# Patient Record
Sex: Male | Born: 1960 | Race: Black or African American | Hispanic: No | Marital: Single | Smoking: Former smoker
Health system: Southern US, Community
[De-identification: ages and names within clinical notes are randomized; demographics above are authoritative.]

## PROBLEM LIST (undated history)

## (undated) DIAGNOSIS — I209 Angina pectoris, unspecified: Secondary | ICD-10-CM

## (undated) DIAGNOSIS — I10 Essential (primary) hypertension: Secondary | ICD-10-CM

## (undated) DIAGNOSIS — L309 Dermatitis, unspecified: Secondary | ICD-10-CM

---

## 2005-06-14 ENCOUNTER — Emergency Department: Payer: Self-pay | Admitting: General Practice

## 2006-07-18 ENCOUNTER — Emergency Department: Payer: Self-pay | Admitting: Emergency Medicine

## 2006-08-30 ENCOUNTER — Ambulatory Visit: Payer: Self-pay | Admitting: Physician Assistant

## 2006-10-20 ENCOUNTER — Other Ambulatory Visit: Payer: Self-pay

## 2006-10-27 ENCOUNTER — Ambulatory Visit: Payer: Self-pay | Admitting: Unknown Physician Specialty

## 2006-11-02 ENCOUNTER — Emergency Department: Payer: Self-pay | Admitting: Emergency Medicine

## 2006-11-25 ENCOUNTER — Emergency Department: Payer: Self-pay | Admitting: General Practice

## 2008-02-20 ENCOUNTER — Ambulatory Visit: Payer: Self-pay | Admitting: Family Medicine

## 2008-07-31 ENCOUNTER — Encounter: Payer: Self-pay | Admitting: Orthopedic Surgery

## 2008-08-28 ENCOUNTER — Encounter: Payer: Self-pay | Admitting: Orthopedic Surgery

## 2009-02-27 ENCOUNTER — Inpatient Hospital Stay: Payer: Self-pay | Admitting: Internal Medicine

## 2009-04-27 ENCOUNTER — Ambulatory Visit: Payer: Self-pay | Admitting: Unknown Physician Specialty

## 2010-04-23 ENCOUNTER — Emergency Department: Payer: Self-pay | Admitting: Emergency Medicine

## 2012-07-10 ENCOUNTER — Emergency Department: Payer: Self-pay | Admitting: Emergency Medicine

## 2012-07-10 LAB — CBC
HCT: 39.9 % — ABNORMAL LOW (ref 40.0–52.0)
HGB: 13.8 g/dL (ref 13.0–18.0)
MCH: 29.2 pg (ref 26.0–34.0)
MCHC: 34.7 g/dL (ref 32.0–36.0)
MCV: 84 fL (ref 80–100)
RDW: 12.9 % (ref 11.5–14.5)
WBC: 5.5 10*3/uL (ref 3.8–10.6)

## 2012-07-10 LAB — COMPREHENSIVE METABOLIC PANEL
Alkaline Phosphatase: 66 U/L (ref 50–136)
Calcium, Total: 8.8 mg/dL (ref 8.5–10.1)
Chloride: 109 mmol/L — ABNORMAL HIGH (ref 98–107)
Co2: 27 mmol/L (ref 21–32)
Creatinine: 1.01 mg/dL (ref 0.60–1.30)
EGFR (African American): >60
EGFR (Non-African Amer.): >60
Glucose: 104 mg/dL — ABNORMAL HIGH (ref 65–99)
SGOT(AST): 28 U/L (ref 15–37)
SGPT (ALT): 23 U/L (ref 12–78)

## 2012-07-10 LAB — CK TOTAL AND CKMB (NOT AT ARMC): CK, Total: 146 U/L (ref 35–232)

## 2019-08-21 ENCOUNTER — Other Ambulatory Visit: Payer: Self-pay

## 2019-08-21 DIAGNOSIS — Z20822 Contact with and (suspected) exposure to covid-19: Secondary | ICD-10-CM

## 2019-08-23 LAB — NOVEL CORONAVIRUS, NAA: SARS-CoV-2, NAA: NOT DETECTED

## 2020-06-17 ENCOUNTER — Other Ambulatory Visit: Payer: Self-pay

## 2020-06-17 ENCOUNTER — Encounter: Payer: Self-pay | Admitting: Urology

## 2020-06-17 ENCOUNTER — Ambulatory Visit: Payer: Managed Care, Other (non HMO) | Admitting: Urology

## 2020-06-17 VITALS — BP 126/77 | HR 83 | Ht 74.0 in | Wt 305.0 lb

## 2020-06-17 DIAGNOSIS — R972 Elevated prostate specific antigen [PSA]: Secondary | ICD-10-CM | POA: Diagnosis not present

## 2020-06-17 DIAGNOSIS — N400 Enlarged prostate without lower urinary tract symptoms: Secondary | ICD-10-CM | POA: Diagnosis not present

## 2020-06-17 DIAGNOSIS — I10 Essential (primary) hypertension: Secondary | ICD-10-CM | POA: Diagnosis not present

## 2020-06-17 DIAGNOSIS — L309 Dermatitis, unspecified: Secondary | ICD-10-CM | POA: Insufficient documentation

## 2020-06-17 DIAGNOSIS — E785 Hyperlipidemia, unspecified: Secondary | ICD-10-CM | POA: Insufficient documentation

## 2020-06-17 DIAGNOSIS — K5792 Diverticulitis of intestine, part unspecified, without perforation or abscess without bleeding: Secondary | ICD-10-CM | POA: Insufficient documentation

## 2020-06-17 MED ORDER — TAMSULOSIN HCL 0.4 MG PO CAPS: 0.4000 mg | ORAL_CAPSULE | Freq: Every day | ORAL | 0 refills | Status: DC

## 2020-06-17 NOTE — Progress Notes (Signed)
   06/17/2020 8:42 AM   Fred Scott Aug 06, 1961 [id number]  Referring provider: Theotis Burrow, MD [address] [place],  [place] [address]  Chief Complaint  Patient presents with  . Elevated PSA    HPI: Fred Scott is a 59 y.o. male seen at the request of Dr. Alene Scott for an elevated PSA.   PSA 05/30/2020 elevated 5.4  Prior PSA results 3.1 07/2019 and 1.5 05/2013  Denies bothersome LUTS  Denies dysuria, gross hematuria  Denies flank, abdominal or pelvic pain  No family history prostate cancer  No prior urologic history or GU evaluation   PMH: History reviewed. No pertinent past medical history.  Surgical History: History reviewed. No pertinent surgical history.  Home Medications:  Allergies as of 06/17/2020   No Known Allergies     Medication List       Accurate as of June 17, 2020  8:42 AM. If you have any questions, ask your nurse or doctor.        amlodipine-atorvastatin 10-10 MG tablet Commonly known as: CADUET Take by mouth.   hydrochlorothiazide 12.5 MG capsule Commonly known as: MICROZIDE Take by mouth.   pravastatin 10 MG tablet Commonly known as: PRAVACHOL Take by mouth.       Allergies: No Known Allergies  Family History: History reviewed. No pertinent family history.  Social History:  reports that he has quit smoking. He has never used smokeless tobacco. No history on file for alcohol use and drug use.   Physical Exam: BP 126/77   Pulse 83   Ht 6\' 2"  (1.88 m)   Wt (!) 305 lb (138.3 kg)   BMI 39.16 kg/m   Constitutional:  Alert and oriented, No acute distress. HEENT: [place] AT, moist mucus membranes.  Trachea midline, no masses. Cardiovascular: No clubbing, cyanosis, or edema. Respiratory: Normal respiratory effort, no increased work of breathing. GU: Prostate 45 g, smooth without nodules Skin: No rashes, bruises or suspicious lesions. Neurologic: Grossly intact, no focal deficits, moving all 4  extremities. Psychiatric: Normal mood and affect.    Assessment & Plan:    1.  Elevated PSA Although PSA is a prostate cancer screening test he was informed that cancer is not the most common cause of an elevated PSA. Other potential causes including BPH and inflammation were discussed. He was informed that the only way to adequately diagnose prostate cancer would be a transrectal ultrasound and biopsy of the prostate. The procedure was discussed including potential risks of bleeding and infection/sepsis. He was also informed that a negative biopsy does not conclusively rule out the possibility that prostate cancer may be present and that continued monitoring is required. The use of newer adjunctive blood tests including PHI and 4kScore were discussed. The use of multiparametric prostate MRI was also discussed however is not typically used for initial evaluation of an elevated PSA. Continued periodic surveillance was also discussed.  I initially recommended a 30-day alpha-blocker course with a repeat PSA in 1 month.  He will think over the above options if his PSA is persistently elevated.  Rx tamsulosin sent to [facility] [address], [place], [place] [address] [phone]

## 2020-07-09 ENCOUNTER — Other Ambulatory Visit: Payer: Self-pay | Admitting: Urology

## 2020-07-20 ENCOUNTER — Other Ambulatory Visit: Payer: Self-pay

## 2020-07-20 DIAGNOSIS — R972 Elevated prostate specific antigen [PSA]: Secondary | ICD-10-CM

## 2020-07-20 DIAGNOSIS — N400 Enlarged prostate without lower urinary tract symptoms: Secondary | ICD-10-CM

## 2020-07-21 ENCOUNTER — Telehealth: Payer: Self-pay | Admitting: Urology

## 2020-07-21 ENCOUNTER — Telehealth: Payer: Self-pay

## 2020-07-21 ENCOUNTER — Other Ambulatory Visit: Payer: Self-pay

## 2020-07-21 MED ORDER — ALFUZOSIN HCL ER 10 MG PO TB24: 10.0000 mg | ORAL_TABLET | Freq: Every day | ORAL | 0 refills | Status: DC

## 2020-07-21 NOTE — Telephone Encounter (Signed)
Patient states he was taking Tamsulosin  For 2 weeks . He states hie allergic reaction is a very bad yeast infection .

## 2020-07-21 NOTE — Telephone Encounter (Signed)
Can cancel the lab visit.  Please find out what the "allergic reaction" was as that will determine any alternative medication

## 2020-07-21 NOTE — Telephone Encounter (Signed)
Pt. Would like a call back ASAP to let him know if he needs to cancel his lab appt. Today at 11:30. He was told by his PCP to stop the Tamsulosin 2 weeks ago due to allergic reaction and he thinks the labs today will not be accurate. He would also like to discuss a new medication option.

## 2020-07-21 NOTE — Telephone Encounter (Signed)
Unlikely Flomax is the cause of the yeast infection.  I sent in a different Rx to pharmacy and he will need lab visit with PSA in 1 month

## 2020-07-21 NOTE — Addendum Note (Signed)
Addended by: Abbie Sons on: 07/21/2020 09:47 PM   Modules accepted: Orders

## 2020-07-21 NOTE — Telephone Encounter (Signed)
Called pt, no answer. Informed pt that it was ok to have PSA drawn today despite discontinuing Flomax.

## 2020-07-22 ENCOUNTER — Other Ambulatory Visit: Payer: Managed Care, Other (non HMO)

## 2020-07-22 NOTE — Telephone Encounter (Signed)
Notified patient as instructed, patient pleased. Discussed follow-up appointments, patient agrees  

## 2020-07-23 ENCOUNTER — Other Ambulatory Visit: Payer: Managed Care, Other (non HMO)

## 2020-08-13 ENCOUNTER — Other Ambulatory Visit: Payer: Self-pay | Admitting: Urology

## 2020-08-24 ENCOUNTER — Other Ambulatory Visit: Payer: Managed Care, Other (non HMO)

## 2020-08-24 ENCOUNTER — Other Ambulatory Visit: Payer: Self-pay

## 2020-08-24 DIAGNOSIS — N400 Enlarged prostate without lower urinary tract symptoms: Secondary | ICD-10-CM

## 2020-08-24 DIAGNOSIS — R972 Elevated prostate specific antigen [PSA]: Secondary | ICD-10-CM

## 2020-08-25 LAB — PSA: Prostate Specific Ag, Serum: 3.9 ng/mL (ref 0.0–4.0)

## 2020-08-28 ENCOUNTER — Other Ambulatory Visit: Payer: Self-pay | Admitting: Urology

## 2020-09-03 ENCOUNTER — Telehealth: Payer: Self-pay | Admitting: *Deleted

## 2020-09-03 NOTE — Telephone Encounter (Signed)
-----   Message from Abbie Sons, MD sent at 09/03/2020 12:32 PM EDT ----- Follow-up PSA improved but still slightly above baseline at 3.9.  Recommend 95-month follow-up visit with repeat PSA prior

## 2020-09-03 NOTE — Telephone Encounter (Signed)
Notified patient as instructed, patient pleased. Discussed follow-up appointments, patient agrees  

## 2020-10-15 ENCOUNTER — Ambulatory Visit
Admission: RE | Admit: 2020-10-15 | Discharge: 2020-10-15 | Disposition: A | Discharge status: Home / Self Care | Payer: 59 | Attending: Family Medicine | Admitting: Family Medicine

## 2020-10-15 ENCOUNTER — Other Ambulatory Visit: Payer: Self-pay

## 2020-10-15 ENCOUNTER — Other Ambulatory Visit: Payer: Self-pay | Admitting: Family Medicine

## 2020-10-15 ENCOUNTER — Ambulatory Visit
Admission: RE | Admit: 2020-10-15 | Discharge: 2020-10-15 | Disposition: A | Discharge status: Home / Self Care | Payer: 59 | Source: Ambulatory Visit | Attending: Family Medicine | Admitting: Family Medicine

## 2020-10-15 DIAGNOSIS — M47817 Spondylosis without myelopathy or radiculopathy, lumbosacral region: Secondary | ICD-10-CM | POA: Insufficient documentation

## 2020-10-15 DIAGNOSIS — M47812 Spondylosis without myelopathy or radiculopathy, cervical region: Secondary | ICD-10-CM | POA: Diagnosis not present

## 2020-10-15 DIAGNOSIS — M549 Dorsalgia, unspecified: Secondary | ICD-10-CM | POA: Diagnosis present

## 2020-11-02 ENCOUNTER — Ambulatory Visit (INDEPENDENT_AMBULATORY_CARE_PROVIDER_SITE_OTHER): Payer: 59 | Admitting: Dermatology

## 2020-11-02 ENCOUNTER — Other Ambulatory Visit: Payer: Self-pay

## 2020-11-02 DIAGNOSIS — L280 Lichen simplex chronicus: Secondary | ICD-10-CM | POA: Diagnosis not present

## 2020-11-02 DIAGNOSIS — L300 Nummular dermatitis: Secondary | ICD-10-CM

## 2020-11-02 MED ORDER — CLOBETASOL PROPIONATE 0.05 % EX CREA: TOPICAL_CREAM | CUTANEOUS | 1 refills | Status: AC

## 2020-11-02 NOTE — Patient Instructions (Addendum)
Clobetasol Cream - Apply to lower legs twice a day until rash improved. Avoid face, groin, underarms. Apply CeraVe Cream after Clobetasol. Recommend Dove Bodywash in shower.    Dry Skin Care  What causes dry skin?  Dry skin is common and results from inadequate moisture in the outer skin layers. Dry skin usually results from the excessive loss of moisture from the skin surface. This occurs due to two major factors: 1. Normally the skin's oil glands deposit a layer of oil on the skin's surface. This layer of oil prevents the loss of moisture from the skin. Exposure to soaps, cleaners, solvents, and disinfectants removes this oily film, allowing water to escape. 2. Water loss from the skin increases when the humidity is low. During winter months we spend a lot of time indoors where the air is heated. Heated air has very low humidity. This also contributes to dry skin.  A tendency for dry skin may accompany such disorders as eczema. Also, as people age, the number of functioning oil glands decreases, and the tendency toward dry skin can be a sensation of skin tightness when emerging from the shower.  How do I manage dry skin?  1. Humidify your environment. This can be accomplished by using a humidifier in your bedroom at night during winter months. 2. Bathing can actually put moisture back into your skin if done right. Take the following steps while bathing to sooth dry skin:  Avoid hot water, which only dries the skin and makes itching worse. Use warm water.  Avoid washcloths or extensive rubbing or scrubbing.  Use mild soaps like unscented Dove, Oil of Olay, Cetaphil, Basis, or CeraVe.  If you take baths rather than showers, rinse off soap residue with clean water before getting out of tub.  Once out of the shower/tub, pat dry gently with a soft towel. Leave your skin damp.  While still damp, apply any medicated ointment/cream you were prescribed to the affected areas. After you apply  your medicated ointment/cream, then apply your moisturizer to your whole body.This is the most important step in dry skin care. If this is omitted, your skin will continue to be dry.  The choice of moisturizer is also very important. In general, lotion will not provider enough moisture to severely dry skin because it is water based. You should use an ointment or cream. Moisturizers should also be unscented. Good choices include Vaseline (plain petrolatum), Aquaphor, Cetaphil, CeraVe, Vanicream, DML Forte, Aveeno moisture, or Eucerin Cream.  Bath oils can be helpful, but do not replace the application of moisturizer after the bath. In addition, they make the tub slippery causing an increased risk for falls. Therefore, we do not recommend their use.

## 2020-11-02 NOTE — Progress Notes (Signed)
   New Patient Visit  Subjective  Fred Scott is a 59 y.o. male who presents for the following: Rash (lower legs, dry and itchy x 1 month, using Cortisone-10). Had similar rash on legs years ago.  Uses Ivory soap and Dermaseal lotion.    The following portions of the chart were reviewed this encounter and updated as appropriate:      Review of Systems:  No other skin or systemic complaints except as noted in HPI or Assessment and Plan.  Objective  Well appearing patient in no apparent distress; mood and affect are within normal limits.  A focused examination was performed including lower legs. Relevant physical exam findings are noted in the Assessment and Plan.  Objective  lower legs: Hyperpigmented violaceous scaly patches with few excoriated firm papules, mild lichenification.   Assessment & Plan  Nummular dermatitis lower legs  With LSC component Start clobetasol cream Apply to itchy areas lower legs qd/bid until rash improved. Avoid face, groin, axilla. Dsp 60g 1Rf.  Recommend mild soap and moisturizing cream 1-2 times daily.  Dove soap and CeraVe Cream daily. Avoid scratching  Topical steroids (such as triamcinolone, fluocinolone, fluocinonide, mometasone, clobetasol, halobetasol, betamethasone, hydrocortisone) can cause thinning and lightening of the skin if they are used for too long in the same area. Your physician has selected the right strength medicine for your problem and area affected on the body. Please use your medication only as directed by your physician to prevent side effects.     clobetasol cream (TEMOVATE) 0.05 % - lower legs  Return if symptoms worsen or fail to improve.   IJamesetta Orleans, CMA, am acting as scribe for Brendolyn Patty, MD .  Documentation: I have reviewed the above documentation for accuracy and completeness, and I agree with the above.  Brendolyn Patty MD

## 2020-12-03 ENCOUNTER — Other Ambulatory Visit: Payer: Self-pay | Admitting: Urology

## 2021-03-02 ENCOUNTER — Other Ambulatory Visit: Payer: Self-pay

## 2021-03-02 DIAGNOSIS — R972 Elevated prostate specific antigen [PSA]: Secondary | ICD-10-CM

## 2021-03-08 ENCOUNTER — Other Ambulatory Visit: Payer: 59

## 2021-03-08 ENCOUNTER — Other Ambulatory Visit: Payer: Self-pay

## 2021-03-08 DIAGNOSIS — R972 Elevated prostate specific antigen [PSA]: Secondary | ICD-10-CM

## 2021-03-09 LAB — PSA: Prostate Specific Ag, Serum: 5.2 ng/mL — ABNORMAL HIGH (ref 0.0–4.0)

## 2021-03-10 ENCOUNTER — Other Ambulatory Visit: Payer: Self-pay

## 2021-03-10 ENCOUNTER — Ambulatory Visit (INDEPENDENT_AMBULATORY_CARE_PROVIDER_SITE_OTHER): Payer: 59 | Admitting: Urology

## 2021-03-10 ENCOUNTER — Encounter: Payer: Self-pay | Admitting: Urology

## 2021-03-10 VITALS — BP 138/83 | HR 84 | Ht 74.0 in | Wt 299.0 lb

## 2021-03-10 DIAGNOSIS — R972 Elevated prostate specific antigen [PSA]: Secondary | ICD-10-CM

## 2021-03-10 DIAGNOSIS — N401 Enlarged prostate with lower urinary tract symptoms: Secondary | ICD-10-CM

## 2021-03-10 MED ORDER — DOXAZOSIN MESYLATE 2 MG PO TABS: 2.0000 mg | ORAL_TABLET | Freq: Every day | ORAL | 1 refills | Status: DC

## 2021-03-10 NOTE — Progress Notes (Signed)
   03/10/2021 9:30 AM   Fred Scott 14-Jan-1961 [id number]  Referring provider: No referring provider defined for this encounter.  Chief Complaint  Patient presents with  . Elevated PSA    HPI: 60 y.o. male presents for 6 month follow-up.   Initially seen 06/17/2020 for a PSA of 5.4  Benign DRE  Took tamsulosin x30 days and repeat PSA was 3.9  Repeat PSA 03/08/2021 increased 5.2  States he could not tolerate tamsulosin secondary to yeast infections  He was switched to alfuzosin and states he voids quite well on this medication however only takes it once a week because it also causes yeast infections  Denies dysuria, gross hematuria  No flank, abdominal or pelvic pain  PMH: No past medical history on file.  Surgical History: No past surgical history on file.  Home Medications:  Allergies as of 03/10/2021   No Known Allergies     Medication List       Accurate as of March 10, 2021  9:30 AM. If you have any questions, ask your nurse or doctor.        alfuzosin 10 MG 24 hr tablet Commonly known as: UROXATRAL TAKE 1 TABLET (10 MG TOTAL) BY MOUTH DAILY WITH BREAKFAST.   amlodipine-atorvastatin 10-10 MG tablet Commonly known as: CADUET Take by mouth.   clobetasol cream 0.05 % Commonly known as: TEMOVATE Apply to itchy rash on legs 2 times daily until improved. Avoid face, groin, underarms.   hydrochlorothiazide 12.5 MG capsule Commonly known as: MICROZIDE Take by mouth.   pravastatin 10 MG tablet Commonly known as: PRAVACHOL Take by mouth.       Allergies: No Known Allergies  Family History: No family history on file.  Social History:  reports that he has quit smoking. He has never used smokeless tobacco. No history on file for alcohol use and drug use.   Physical Exam: BP 138/83   Pulse 84   Ht 6\' 2"  (1.88 m)   Wt 299 lb (135.6 kg)   BMI 38.39 kg/m   Constitutional:  Alert and oriented, No acute distress. HEENT: [place] AT, moist mucus  membranes.  Trachea midline, no masses. Cardiovascular: No clubbing, cyanosis, or edema. Respiratory: Normal respiratory effort, no increased work of breathing. Skin: No rashes, bruises or suspicious lesions. Neurologic: Grossly intact, no focal deficits, moving all 4 extremities. Psychiatric: Normal mood and affect.   Assessment & Plan:    1.  Elevated PSA  He requested a "less strong" alpha-blocker and wanted to have his PSA repeated in 1 month.  If PSA remains elevated he desires to schedule prostate MRI  Rx doxazosin 2 mg nightly sent to pharmacy, potential side effects were discussed  2.  BPH with LUTS  As above   Abbie Sons, MD  [facility] [address], [place], [place] [address] [phone]

## 2021-03-16 ENCOUNTER — Other Ambulatory Visit: Payer: Self-pay | Admitting: *Deleted

## 2021-03-16 DIAGNOSIS — R972 Elevated prostate specific antigen [PSA]: Secondary | ICD-10-CM

## 2021-04-01 ENCOUNTER — Other Ambulatory Visit: Payer: Self-pay | Admitting: Urology

## 2021-04-12 ENCOUNTER — Other Ambulatory Visit: Payer: 59

## 2021-04-12 ENCOUNTER — Other Ambulatory Visit: Payer: Self-pay

## 2021-04-12 DIAGNOSIS — R972 Elevated prostate specific antigen [PSA]: Secondary | ICD-10-CM

## 2021-04-13 ENCOUNTER — Encounter: Payer: Self-pay | Admitting: Urology

## 2021-04-13 ENCOUNTER — Telehealth: Payer: Self-pay | Admitting: *Deleted

## 2021-04-13 DIAGNOSIS — R972 Elevated prostate specific antigen [PSA]: Secondary | ICD-10-CM

## 2021-04-13 LAB — PSA: Prostate Specific Ag, Serum: 12.1 ng/mL — ABNORMAL HIGH (ref 0.0–4.0)

## 2021-04-13 NOTE — Telephone Encounter (Signed)
Patient called in today and is asking to be scheduled for prostate MRI.

## 2021-04-16 NOTE — Addendum Note (Signed)
Addended by: Abbie Sons on: 04/16/2021 12:04 PM   Modules accepted: Orders

## 2021-04-20 ENCOUNTER — Other Ambulatory Visit: Payer: Self-pay

## 2021-04-20 ENCOUNTER — Encounter: Payer: Self-pay | Admitting: Dermatology

## 2021-04-20 ENCOUNTER — Ambulatory Visit (INDEPENDENT_AMBULATORY_CARE_PROVIDER_SITE_OTHER): Payer: 59 | Admitting: Dermatology

## 2021-04-20 DIAGNOSIS — L308 Other specified dermatitis: Secondary | ICD-10-CM

## 2021-04-20 MED ORDER — TRIAMCINOLONE ACETONIDE 0.1 % EX OINT: TOPICAL_OINTMENT | CUTANEOUS | 0 refills | Status: DC

## 2021-04-20 MED ORDER — TACROLIMUS 0.1 % EX OINT: TOPICAL_OINTMENT | Freq: Two times a day (BID) | CUTANEOUS | 1 refills | Status: AC

## 2021-04-20 NOTE — Progress Notes (Signed)
   Follow-Up Visit   Subjective  Fred Scott is a 60 y.o. male who presents for the following: Rash (Patient here today for dermatitis at lower legs. He saw Dr. Nicole Kindred in December 2021 and was prescribed clobetasol which helped a little bit but is no longer covered by his insurance. Patient is not using anything right now. He has also noticed rash at arms. ).  Patient advises legs did clear quite a bit when using clobetasol. He also uses CeraVe for rough skin and Dove soap for men. Years ago he was using a steroid cream and wrapping with saran wrap which cleared the rash up and was not present for about 10 years.   The following portions of the chart were reviewed this encounter and updated as appropriate:   Tobacco  Allergies  Meds  Problems  Med Hx  Surg Hx  Fam Hx      Review of Systems:  No other skin or systemic complaints except as noted in HPI or Assessment and Plan.  Objective  Well appearing patient in no apparent distress; mood and affect are within normal limits.  A focused examination was performed including arms, legs. Relevant physical exam findings are noted in the Assessment and Plan.  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 Condition is bothersome to patient. Currently flared.  Recommend Gentle Skin Care - information attached to AVS  Consider patch testing in future since this has worsened over time  Clobetasol too expensive with insurance  Start TMC 0.1% ointment to affected areas of rash at lower legs at bedtime and wrap legs with saran wrap for up to 2 weeks. Once improved, may discontinue saran wrap and switch to tacrolimus. For areas at arms, may use twice daily for up to 2 weeks or until clear then switch to  tacrolimus. Avoid applying to face, groin, and axilla. Use as directed. Risk of skin atrophy with long-term use reviewed. #[id number]  Start tacrolimus 0.1% ointment to affected areas of rash twice daily after first 2 weeks.  Topical steroids (such as triamcinolone, fluocinolone, fluocinonide, mometasone, clobetasol, halobetasol, betamethasone, hydrocortisone) can cause thinning and lightening of the skin if they are used for too long in the same area. Your physician has selected the right strength medicine for your problem and area affected on the body. Please use your medication only as directed by your physician to prevent side effects.   Atopic dermatitis (eczema) is a chronic, relapsing, pruritic condition that can significantly affect quality of life.   Ordered Medications: triamcinolone ointment (KENALOG) 0.1 % tacrolimus (PROTOPIC) 0.1 % ointment  Return in about 3 months (around 07/21/2021) for Dermatitis.  Graciella Belton, RMA, am acting as scribe for Forest Gleason, MD .  Documentation: I have reviewed the above documentation for accuracy and completeness, and I agree with the above.  Forest Gleason, MD

## 2021-04-20 NOTE — Patient Instructions (Addendum)
Gentle Skin Care Guide  1. Bathe no more than once a day.  2. Avoid bathing in hot water  3. Use a mild soap like Dove, Vanicream, Cetaphil, CeraVe. Can use Lever 2000 or Cetaphil antibacterial soap  4. Use soap only where you need it. On most days, use it under your arms, between your legs, and on your feet. Let the water rinse other areas unless visibly dirty.  5. When you get out of the bath/shower, use a towel to gently blot your skin dry, don't rub it.  6. While your skin is still a little damp, apply a moisturizing cream such as Vanicream, CeraVe, Cetaphil, Eucerin, Sarna lotion or plain Vaseline Jelly. For hands apply Neutrogena [place] Hand Cream or Excipial Hand Cream.  7. Reapply moisturizer any time you start to itch or feel dry.  8. Sometimes using free and clear laundry detergents can be helpful. Fabric softener sheets should be avoided. Downy Free & Gentle liquid, or any liquid fabric softener that is free of dyes and perfumes, it acceptable to use  9. If your doctor has given you prescription creams you may apply moisturizers over them    Start TMC 0.1% ointment to affected areas of rash at lower legs at bedtime and wrap legs with saran wrap for up to 2 weeks. Once improved, may discontinue saran wrap and switch to tacrolimus. For areas at arms, may use twice daily for up to 2 weeks or until clear then switch to tacrolimus. Avoid applying to face, groin, and axilla. Use as directed. Risk of skin atrophy with long-term use reviewed.   Start tacrolimus 0.1% ointment to affected areas of rash twice daily once improved.   Topical steroids (such as triamcinolone, fluocinolone, fluocinonide, mometasone, clobetasol, halobetasol, betamethasone, hydrocortisone) can cause thinning and lightening of the skin if they are used for too long in the same area. Your physician has selected the right strength medicine for your problem and area affected on the body. Please use your medication  only as directed by your physician to prevent side effects.   If you have any questions or concerns for your doctor, please call our main line at [phone] and press option 4 to reach your doctor's medical assistant. If no one answers, please leave a voicemail as directed and we will return your call as soon as possible. Messages left after 4 pm will be answered the following business day.   You may also send [place] a message via [place]. We typically respond to MyChart messages within 1-2 business days.  For prescription refills, please ask your pharmacy to contact our office. Our fax number is [phone].  If you have an urgent issue when the clinic is closed that cannot wait until the next business day, you can page your doctor at the number below.    Please note that while we do our best to be available for urgent issues outside of office hours, we are not available 24/7.   If you have an urgent issue and are unable to reach [place], you may choose to seek medical care at your doctor's office, retail clinic, urgent care center, or emergency room.  If you have a medical emergency, please immediately call 911 or go to the emergency department.  Pager Numbers  - Dr. Nehemiah Massed: [phone]  - Dr. Laurence Ferrari: [phone]  - Dr. Nicole Kindred: [phone]  In the event of inclement weather, please call our main line at [phone] for an update on the status of any delays or  closures.  Dermatology Medication Tips: Please keep the boxes that topical medications come in in order to help keep track of the instructions about where and how to use these. Pharmacies typically print the medication instructions only on the boxes and not directly on the medication tubes.   If your medication is too expensive, please contact our office at [phone] option 4 or send [place] a message through [place].   We are unable to tell what your co-pay for medications will be in advance as this is different depending on your  insurance coverage. However, we may be able to find a substitute medication at lower cost or fill out paperwork to get insurance to cover a needed medication.   If a prior authorization is required to get your medication covered by your insurance company, please allow [place] 1-2 business days to complete this process.  Drug prices often vary depending on where the prescription is filled and some pharmacies may offer cheaper prices.  The website www.goodrx.com contains coupons for medications through different pharmacies. The prices here do not account for what the cost may be with help from insurance (it may be cheaper with your insurance), but the website can give you the price if you did not use any insurance.  - You can print the associated coupon and take it with your prescription to the pharmacy.  - You may also stop by our office during regular business hours and pick up a GoodRx coupon card.  - If you need your prescription sent electronically to a different pharmacy, notify our office through [facility] or by phone at [phone] option 4.

## 2021-04-21 ENCOUNTER — Telehealth (INDEPENDENT_AMBULATORY_CARE_PROVIDER_SITE_OTHER): Payer: Self-pay | Admitting: Gastroenterology

## 2021-04-21 DIAGNOSIS — Z86010 Personal history of colonic polyps: Secondary | ICD-10-CM

## 2021-04-21 MED ORDER — PEG 3350-KCL-NA BICARB-NACL 420 G PO SOLR: 4000.0000 mL | Freq: Once | ORAL | 0 refills | Status: AC

## 2021-04-21 NOTE — Progress Notes (Signed)
Gastroenterology Pre-Procedure Review  Request Date: 04/30/2021  Requesting Physician: Dr. Bonna Gains   PATIENT REVIEW QUESTIONS: The patient responded to the following health history questions as indicated:    1. Are you having any GI issues? no 2. Do you have a personal history of Polyps? yes (10 years ago ) 3. Do you have a family history of Colon Cancer or Polyps? no 4. Diabetes Mellitus? no 5. Joint replacements in the past 12 months?no 6. Major health problems in the past 3 months?no 7. Any artificial heart valves, MVP, or defibrillator?no    MEDICATIONS & ALLERGIES:    Patient reports the following regarding taking any anticoagulation/antiplatelet therapy:   Plavix, Coumadin, Eliquis, Xarelto, Lovenox, Pradaxa, Brilinta, or Effient? no Aspirin? no  Patient confirms/reports the following medications:  Current Outpatient Medications  Medication Sig Dispense Refill  . alfuzosin (UROXATRAL) 10 MG 24 hr tablet TAKE 1 TABLET (10 MG TOTAL) BY MOUTH DAILY WITH BREAKFAST. 90 tablet 0  . amlodipine-atorvastatin (CADUET) 10-10 MG tablet Take by mouth.    . clobetasol cream (TEMOVATE) 0.05 % Apply to itchy rash on legs 2 times daily until improved. Avoid face, groin, underarms. 60 g 1  . pravastatin (PRAVACHOL) 10 MG tablet Take by mouth.    . tacrolimus (PROTOPIC) 0.1 % ointment Apply topically 2 (two) times daily. 100 g 1  . triamcinolone ointment (KENALOG) 0.1 % Apply to affected areas of rash as directed. Avoid applying to face, groin, and axilla. Use as directed. Risk of skin atrophy with long-term use reviewed. 454 g 0  . doxazosin (CARDURA) 2 MG tablet Take 1 tablet (2 mg total) by mouth at bedtime. (Patient not taking: No sig reported) 30 tablet 1  . hydrochlorothiazide (MICROZIDE) 12.5 MG capsule Take by mouth. (Patient not taking: No sig reported)     No current facility-administered medications for this visit.    Patient confirms/reports the following allergies:  No Known  Allergies  No orders of the defined types were placed in this encounter.   AUTHORIZATION INFORMATION Primary Insurance: 1D#: Group #:  Secondary Insurance: 1D#: Group #:  SCHEDULE INFORMATION: Date: 04/30/2021  Time: Location: [place]

## 2021-04-29 ENCOUNTER — Encounter: Payer: Self-pay | Admitting: Gastroenterology

## 2021-04-30 ENCOUNTER — Other Ambulatory Visit: Payer: Self-pay

## 2021-04-30 ENCOUNTER — Encounter: Payer: Self-pay | Admitting: Gastroenterology

## 2021-04-30 ENCOUNTER — Encounter
Admission: RE | Disposition: A | Discharge status: Home / Self Care | Payer: Self-pay | Source: Home / Self Care | Attending: Gastroenterology

## 2021-04-30 ENCOUNTER — Ambulatory Visit
Admission: RE | Admit: 2021-04-30 | Discharge: 2021-04-30 | Disposition: A | Discharge status: Home / Self Care | Payer: 59 | Attending: Gastroenterology | Admitting: Gastroenterology

## 2021-04-30 DIAGNOSIS — Z5309 Procedure and treatment not carried out because of other contraindication: Secondary | ICD-10-CM | POA: Insufficient documentation

## 2021-04-30 DIAGNOSIS — Z86010 Personal history of colonic polyps: Secondary | ICD-10-CM | POA: Diagnosis present

## 2021-04-30 HISTORY — DX: Essential (primary) hypertension: I10

## 2021-04-30 HISTORY — DX: Angina pectoris, unspecified: I20.9

## 2021-04-30 HISTORY — DX: Dermatitis, unspecified: L30.9

## 2021-04-30 SURGERY — COLONOSCOPY WITH PROPOFOL
Anesthesia: General

## 2021-04-30 NOTE — OR Nursing (Signed)
Dr. Bonna Gains notified of pt eating solid foods until 1800 yesterday, last stool after completing prep was liquid brown, unable to see through stool.  Procedure has been canceled. Office will contact pt to reschedule.

## 2021-05-19 ENCOUNTER — Ambulatory Visit
Admission: RE | Admit: 2021-05-19 | Discharge: 2021-05-19 | Disposition: A | Discharge status: Home / Self Care | Payer: 59 | Source: Ambulatory Visit | Attending: Urology | Admitting: Urology

## 2021-05-19 DIAGNOSIS — R972 Elevated prostate specific antigen [PSA]: Secondary | ICD-10-CM | POA: Insufficient documentation

## 2021-05-19 MED ORDER — GADOBUTROL 1 MMOL/ML IV SOLN
10.0000 mL | Freq: Once | INTRAVENOUS | Status: AC | PRN
  Administered 2021-05-19: 10 mL via INTRAVENOUS

## 2021-05-24 ENCOUNTER — Telehealth: Payer: Self-pay | Admitting: *Deleted

## 2021-05-24 NOTE — Telephone Encounter (Signed)
-----   Message from Abbie Sons, MD sent at 05/23/2021 10:44 AM EDT ----- Prostate MRI showed no findings suspicious for high-grade prostate cancer.  Significant PSA rise may be secondary to inflammation.  Recommend follow-up lab visit 1 month for repeat PSA.

## 2021-05-24 NOTE — Telephone Encounter (Signed)
Notified patient as instructed, patient pleased. Discussed follow-up appointments, patient agrees  

## 2021-06-07 ENCOUNTER — Other Ambulatory Visit: Payer: Self-pay | Admitting: Urology

## 2021-06-22 ENCOUNTER — Other Ambulatory Visit: Payer: Self-pay | Admitting: Urology

## 2021-06-24 ENCOUNTER — Other Ambulatory Visit: Payer: Self-pay

## 2021-06-28 ENCOUNTER — Other Ambulatory Visit: Payer: Self-pay

## 2021-06-28 DIAGNOSIS — R972 Elevated prostate specific antigen [PSA]: Secondary | ICD-10-CM

## 2021-06-29 ENCOUNTER — Other Ambulatory Visit: Payer: Self-pay

## 2021-06-29 ENCOUNTER — Other Ambulatory Visit: Payer: 59

## 2021-06-29 DIAGNOSIS — R972 Elevated prostate specific antigen [PSA]: Secondary | ICD-10-CM

## 2021-06-30 ENCOUNTER — Encounter: Payer: Self-pay | Admitting: *Deleted

## 2021-06-30 LAB — PSA: Prostate Specific Ag, Serum: 5.1 ng/mL — ABNORMAL HIGH (ref 0.0–4.0)

## 2021-07-21 ENCOUNTER — Ambulatory Visit: Payer: 59 | Admitting: Dermatology

## 2021-08-01 ENCOUNTER — Other Ambulatory Visit: Payer: Self-pay | Admitting: Urology

## 2021-09-24 ENCOUNTER — Other Ambulatory Visit: Payer: Self-pay | Admitting: Urology

## 2021-10-20 ENCOUNTER — Other Ambulatory Visit: Payer: Self-pay | Admitting: Urology

## 2021-11-03 ENCOUNTER — Other Ambulatory Visit: Payer: Self-pay | Admitting: Urology

## 2021-11-05 ENCOUNTER — Other Ambulatory Visit: Payer: Self-pay | Admitting: Urology

## 2021-11-05 MED ORDER — DOXAZOSIN MESYLATE 2 MG PO TABS: ORAL_TABLET | ORAL | 1 refills | Status: DC

## 2021-12-29 ENCOUNTER — Other Ambulatory Visit: Payer: Self-pay

## 2021-12-29 DIAGNOSIS — R972 Elevated prostate specific antigen [PSA]: Secondary | ICD-10-CM

## 2021-12-31 ENCOUNTER — Encounter: Payer: Self-pay | Admitting: Urology

## 2021-12-31 ENCOUNTER — Other Ambulatory Visit: Payer: Self-pay

## 2022-01-05 ENCOUNTER — Ambulatory Visit: Payer: Self-pay | Admitting: Urology

## 2022-01-05 ENCOUNTER — Encounter: Payer: Self-pay | Admitting: Urology
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
## 2022-03-15 ENCOUNTER — Other Ambulatory Visit: Payer: Self-pay | Admitting: Dermatology

## 2022-03-15 DIAGNOSIS — L308 Other specified dermatitis: Secondary | ICD-10-CM

## 2022-06-27 ENCOUNTER — Other Ambulatory Visit: Payer: Self-pay | Admitting: Urology

## 2022-07-01 ENCOUNTER — Other Ambulatory Visit: Payer: 59

## 2022-07-01 DIAGNOSIS — R972 Elevated prostate specific antigen [PSA]: Secondary | ICD-10-CM | POA: Diagnosis not present

## 2022-07-02 LAB — PSA: Prostate Specific Ag, Serum: 6 ng/mL — ABNORMAL HIGH (ref 0.0–4.0)

## 2022-07-08 ENCOUNTER — Encounter: Payer: Self-pay | Admitting: Urology

## 2022-07-08 ENCOUNTER — Ambulatory Visit: Payer: 59 | Admitting: Urology

## 2022-07-08 VITALS — BP 145/81 | HR 87 | Ht 74.5 in | Wt 328.0 lb

## 2022-07-08 DIAGNOSIS — R972 Elevated prostate specific antigen [PSA]: Secondary | ICD-10-CM | POA: Diagnosis not present

## 2022-07-08 DIAGNOSIS — N401 Enlarged prostate with lower urinary tract symptoms: Secondary | ICD-10-CM | POA: Diagnosis not present

## 2022-07-08 MED ORDER — ALFUZOSIN HCL ER 10 MG PO TB24: 10.0000 mg | ORAL_TABLET | Freq: Every day | ORAL | 3 refills | Status: DC

## 2022-07-08 NOTE — Progress Notes (Signed)
07/08/2022 8:36 AM   [name] 03/28/1961 [id number]  Referring provider: [facility], [facility] [place],  [place] [address]  Chief Complaint  Patient presents with   Elevated PSA    Urologic history: 1.  BPH with LUTS Alfuzosin 10 mg daily (side effects with tamsulosin)  2.  Elevated PSA Baseline PSA low 5 range; transient elevation to 12.1 Prostate MRI 05/19/2254 cc gland with no suspicious lesions noted  HPI: 61 y.o. male presents for annual follow-up.  No bothersome LUTS.  Doing well on alfuzosin.  Doxazosin is on his medication list however he states he is not taking Denies dysuria, gross hematuria No flank, abdominal or pelvic pain PSA 07/01/2022 above baseline at 6.0  PMH: Past Medical History:  Diagnosis Date   Anginal pain ([place])    Eczema    Hypertension     Surgical History: Past Surgical History:  Procedure Laterality Date   COLONOSCOPY  2002   HEMATOMA EVACUATION Right 1990   right hip    Home Medications:  Allergies as of 07/08/2022   No Known Allergies      Medication List        Accurate as of July 08, 2022  8:36 AM. If you have any questions, ask your nurse or doctor.          STOP taking these medications    amlodipine-atorvastatin 10-10 MG tablet Commonly known as: CADUET Stopped by: Abbie Sons, MD       TAKE these medications    amLODipine 10 MG tablet Commonly known as: NORVASC Take 10 mg by mouth daily.   clobetasol cream 0.05 % Commonly known as: TEMOVATE Apply to itchy rash on legs 2 times daily until improved. Avoid face, groin, underarms.   doxazosin 2 MG tablet Commonly known as: CARDURA TAKE 1 TABLET BY MOUTH EVERYDAY AT BEDTIME   hydrochlorothiazide 12.5 MG capsule Commonly known as: MICROZIDE Take by mouth.   pravastatin 10 MG tablet Commonly known as: PRAVACHOL Take by mouth.   sildenafil 50 MG tablet Commonly known as: VIAGRA Take by mouth.   tacrolimus 0.1 %  ointment Commonly known as: PROTOPIC Apply topically 2 (two) times daily.   triamcinolone ointment 0.1 % Commonly known as: KENALOG APPLY TO AFFECTED AREAS OF RASH AS DIRECTED. AVOID APPLYING TO FACE, GROIN, AND AXILLA. USE AS DIRECTED. RISK OF SKIN ATROPHY WITH LONG-TERM USE REVIEWED.        Allergies: No Known Allergies  Family History: History reviewed. No pertinent family history.  Social History:  reports that he has quit smoking. He has never used smokeless tobacco. No history on file for alcohol use and drug use.   Physical Exam: BP (!) 145/81   Pulse 87   Ht 6' 2.5" (1.892 m)   Wt (!) 328 lb (148.8 kg)   BMI 41.55 kg/m   Constitutional:  Alert and oriented, No acute distress. HEENT: [place] AT, moist mucus membranes.  Trachea midline, no masses. Cardiovascular: No clubbing, cyanosis, or edema. Respiratory: Normal respiratory effort, no increased work of breathing. Prostate 50 g, smooth without nodules Skin: No rashes, bruises or suspicious lesions. Neurologic: Grossly intact, no focal deficits, moving all 4 extremities. Psychiatric: Normal mood and affect.   Assessment & Plan:    1.  Elevated PSA Benign DRE PSA slightly elevated above baseline.  He states he did have intercourse the evening prior to blood draw Prostate MRI 1 year ago without abnormalities We discussed the false-negative rate of prostate  MRI of ~ 15-20% PSA density is 0.1 which is more indicative of a benign etiology Options of scheduling a standard prostate biopsy versus rechecking the PSA were discussed and he has elected the latter Schedule lab visit 3 months for repeat PSA.  Abstain from intercourse 24 hours prior to blood draw  2.  BPH with LUTS Stable Alfuzosin refilled   Abbie Sons, MD  [facility] [address], [place], [place] [address] [phone]

## 2022-08-16 ENCOUNTER — Telehealth: Payer: Self-pay | Admitting: Family Medicine

## 2022-08-16 ENCOUNTER — Encounter: Payer: Self-pay | Admitting: Family Medicine

## 2022-08-16 MED ORDER — DOXAZOSIN MESYLATE 2 MG PO TABS: 2.0000 mg | ORAL_TABLET | Freq: Every day | ORAL | 1 refills | Status: DC

## 2022-08-16 NOTE — Telephone Encounter (Signed)
Patient called and states he was given Alfuzosin at his last visit and in the past he got a rash on his penis and they switched the medication to Doxazosin and it worked well. He started the Alfuzosin again and has gotten the same rash. I discontinued the medication and changed it back to the Doxazosin and put the Alfuzosin on the allergy list.

## 2022-09-28 DIAGNOSIS — Z1389 Encounter for screening for other disorder: Secondary | ICD-10-CM | POA: Diagnosis not present

## 2022-09-28 DIAGNOSIS — R0602 Shortness of breath: Secondary | ICD-10-CM | POA: Diagnosis not present

## 2022-10-10 ENCOUNTER — Other Ambulatory Visit: Payer: 59

## 2022-10-10 DIAGNOSIS — R972 Elevated prostate specific antigen [PSA]: Secondary | ICD-10-CM

## 2022-10-11 LAB — PSA: Prostate Specific Ag, Serum: 6.3 ng/mL — ABNORMAL HIGH (ref 0.0–4.0)

## 2022-10-14 ENCOUNTER — Other Ambulatory Visit: Payer: Self-pay | Admitting: Urology

## 2022-10-14 ENCOUNTER — Encounter: Payer: Self-pay | Admitting: Urology

## 2022-10-14 DIAGNOSIS — R972 Elevated prostate specific antigen [PSA]: Secondary | ICD-10-CM

## 2022-10-18 ENCOUNTER — Telehealth: Payer: Self-pay | Admitting: Urology

## 2022-10-18 ENCOUNTER — Telehealth: Payer: Self-pay | Admitting: Family Medicine

## 2022-10-18 NOTE — Telephone Encounter (Signed)
Recommend scheduling a prostate biopsy

## 2022-10-18 NOTE — Telephone Encounter (Signed)
Patient states he is unable to do the Prostate MRI. He says he is severely claustrophobic and will not take anything to keep him calm as he is a recovering addict. Is there another test you would like to recommend for patient?

## 2022-10-18 NOTE — Telephone Encounter (Signed)
10/18/22~Called patient to schd MR Prostate, Patient refused to schd appt and stated that he do not like getting in MRI Machine. Advised patient to s/w ref office. Secure Chat sent to ref office. MF

## 2022-10-19 ENCOUNTER — Encounter: Payer: Self-pay | Admitting: Family Medicine

## 2022-10-19 ENCOUNTER — Other Ambulatory Visit: Payer: Self-pay | Admitting: Urology

## 2022-10-19 NOTE — Telephone Encounter (Signed)
Spoke to patient and he is scheduled for Biopsy, instructions sent to his Mychart

## 2022-11-02 ENCOUNTER — Other Ambulatory Visit: Payer: 59 | Admitting: Urology

## 2022-11-02 ENCOUNTER — Other Ambulatory Visit: Payer: Self-pay | Admitting: Urology

## 2022-11-16 ENCOUNTER — Other Ambulatory Visit: Payer: 59 | Admitting: Urology

## 2022-11-28 ENCOUNTER — Other Ambulatory Visit: Payer: Self-pay | Admitting: Urology

## 2022-12-05 ENCOUNTER — Ambulatory Visit
Admission: RE | Admit: 2022-12-05 | Discharge: 2022-12-05 | Disposition: A | Discharge status: Home / Self Care | Payer: 59 | Source: Ambulatory Visit | Attending: Family Medicine | Admitting: Family Medicine

## 2022-12-05 ENCOUNTER — Ambulatory Visit
Admission: RE | Admit: 2022-12-05 | Discharge: 2022-12-05 | Disposition: A | Discharge status: Home / Self Care | Payer: 59 | Attending: Family Medicine | Admitting: Family Medicine

## 2022-12-05 ENCOUNTER — Other Ambulatory Visit: Payer: Self-pay | Admitting: Family Medicine

## 2022-12-05 DIAGNOSIS — R0602 Shortness of breath: Secondary | ICD-10-CM | POA: Insufficient documentation

## 2022-12-07 ENCOUNTER — Other Ambulatory Visit: Payer: 59 | Admitting: Urology

## 2022-12-07 ENCOUNTER — Ambulatory Visit (INDEPENDENT_AMBULATORY_CARE_PROVIDER_SITE_OTHER): Payer: 59 | Admitting: Urology

## 2022-12-07 VITALS — BP 164/89 | HR 97 | Ht 74.0 in | Wt 329.0 lb

## 2022-12-07 DIAGNOSIS — R972 Elevated prostate specific antigen [PSA]: Secondary | ICD-10-CM

## 2022-12-07 DIAGNOSIS — C61 Malignant neoplasm of prostate: Secondary | ICD-10-CM

## 2022-12-07 DIAGNOSIS — Z7689 Persons encountering health services in other specified circumstances: Secondary | ICD-10-CM | POA: Diagnosis not present

## 2022-12-07 MED ORDER — GENTAMICIN SULFATE 40 MG/ML IJ SOLN
80.0000 mg | Freq: Once | INTRAMUSCULAR | Status: AC
  Administered 2022-12-07: 80 mg via INTRAMUSCULAR

## 2022-12-07 MED ORDER — LEVOFLOXACIN 500 MG PO TABS
500.0000 mg | ORAL_TABLET | Freq: Once | ORAL | Status: AC
  Administered 2022-12-07: 500 mg via ORAL

## 2022-12-07 NOTE — Progress Notes (Signed)
Prostate Biopsy Procedure   Informed consent was obtained after discussing risks/benefits of the procedure.  A time out was performed to ensure correct patient identity.  Pre-Procedure: - Last PSA Level: 6.3.  Prior negative MRI - Gentamicin given prophylactically - Levaquin 500 mg administered PO -Transrectal Ultrasound performed revealing a 74 gm prostate -No significant hypoechoic or median lobe noted  Procedure: - Prostate block performed using 10 cc 1% lidocaine and biopsies taken from sextant areas, a total of 12 under ultrasound guidance.  Post-Procedure: - Patient tolerated the procedure well - He was counseled to seek immediate medical attention if experiences any severe pain, significant bleeding, or fevers -Will call with biopsy results   John Giovanni, MD

## 2022-12-13 LAB — SURGICAL PATHOLOGY

## 2022-12-18 ENCOUNTER — Telehealth: Payer: Self-pay | Admitting: Urology

## 2022-12-18 NOTE — Telephone Encounter (Signed)
I contacted Mr. Wisenbaker to discuss his prostate biopsy results.  Left a message on his voicemail to contact the office tomorrow.

## 2022-12-19 DIAGNOSIS — R7309 Other abnormal glucose: Secondary | ICD-10-CM | POA: Diagnosis not present

## 2022-12-19 DIAGNOSIS — Z0131 Encounter for examination of blood pressure with abnormal findings: Secondary | ICD-10-CM | POA: Diagnosis not present

## 2022-12-19 DIAGNOSIS — E785 Hyperlipidemia, unspecified: Secondary | ICD-10-CM | POA: Diagnosis not present

## 2022-12-19 DIAGNOSIS — J189 Pneumonia, unspecified organism: Secondary | ICD-10-CM | POA: Diagnosis not present

## 2022-12-19 DIAGNOSIS — I10 Essential (primary) hypertension: Secondary | ICD-10-CM | POA: Diagnosis not present

## 2022-12-19 DIAGNOSIS — Z1389 Encounter for screening for other disorder: Secondary | ICD-10-CM | POA: Diagnosis not present

## 2022-12-20 NOTE — Telephone Encounter (Signed)
Patient returned your call to discuss prostate biopsy results, advised pt that you are in surgery and you will call at your earliest convenience

## 2022-12-20 NOTE — Telephone Encounter (Signed)
I contacted Mr. Reinoso to discuss his prostate biopsy report.  He had no postbiopsy complaints.  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  We discussed NCCN guidelines that active surveillance is the preferred management options of very low risk prostate cancer.  We discussed this consist of twice yearly visits for PSA with a DRE annually.  The recommendation of a repeat biopsy within 1-2 years of his original biopsy was also discussed.  He has elected active surveillance and will schedule a 26-monthfollow-up appointment with PSA.

## 2022-12-21 ENCOUNTER — Ambulatory Visit: Payer: 59 | Admitting: Urology

## 2023-01-07 ENCOUNTER — Other Ambulatory Visit: Payer: Self-pay | Admitting: Urology

## 2023-02-08 ENCOUNTER — Other Ambulatory Visit: Payer: Self-pay | Admitting: Urology

## 2023-02-12 ENCOUNTER — Other Ambulatory Visit: Payer: Self-pay | Admitting: Dermatology

## 2023-02-12 DIAGNOSIS — L308 Other specified dermatitis: Secondary | ICD-10-CM

## 2023-04-24 LAB — COLOGUARD: COLOGUARD: POSITIVE — AB
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
## 2023-05-01 ENCOUNTER — Ambulatory Visit: Payer: Self-pay | Admitting: Internal Medicine

## 2023-05-15 ENCOUNTER — Institutional Professional Consult (permissible substitution): Payer: 59 | Admitting: Internal Medicine

## 2023-05-29 ENCOUNTER — Other Ambulatory Visit: Payer: Self-pay | Admitting: *Deleted

## 2023-05-29 DIAGNOSIS — R972 Elevated prostate specific antigen [PSA]: Secondary | ICD-10-CM

## 2023-06-21 ENCOUNTER — Encounter: Payer: Self-pay | Admitting: Urology

## 2023-06-21 ENCOUNTER — Other Ambulatory Visit: Payer: 59

## 2023-06-23 ENCOUNTER — Ambulatory Visit: Payer: 59 | Admitting: Urology

## 2023-06-23 ENCOUNTER — Encounter: Payer: Self-pay | Admitting: Urology

## 2023-08-02 ENCOUNTER — Other Ambulatory Visit: Payer: Self-pay | Admitting: Dermatology

## 2023-08-02 DIAGNOSIS — L308 Other specified dermatitis: Secondary | ICD-10-CM

## 2023-08-03 ENCOUNTER — Other Ambulatory Visit: Payer: Self-pay | Admitting: Dermatology

## 2023-08-03 DIAGNOSIS — L308 Other specified dermatitis: Secondary | ICD-10-CM

## 2023-11-17 NOTE — Progress Notes (Signed)
No show

## 2023-11-20 ENCOUNTER — Ambulatory Visit: Payer: Self-pay | Admitting: Internal Medicine

## 2024-01-02 ENCOUNTER — Other Ambulatory Visit: Payer: Self-pay | Admitting: Urology

## 2024-01-06 ENCOUNTER — Other Ambulatory Visit: Payer: Self-pay | Admitting: Urology

## 2024-02-08 ENCOUNTER — Telehealth: Payer: Self-pay | Admitting: Urology

## 2024-02-08 NOTE — Telephone Encounter (Signed)
 Patient called and would like to know if a RX refill for Doxazosin can be sent to [facility] on [address]. Patient states that he does not have any left. Please advise patient.

## 2024-02-08 NOTE — Telephone Encounter (Signed)
 Patient has not been seen in a year , needs a appt schedueld

## 2024-02-08 NOTE — Telephone Encounter (Signed)
Will call to schedule. Thank you

## 2024-02-13 ENCOUNTER — Ambulatory Visit: Admitting: Physician Assistant

## 2024-06-18 ENCOUNTER — Telehealth: Payer: Self-pay

## 2024-06-18 ENCOUNTER — Other Ambulatory Visit: Payer: Self-pay

## 2024-06-18 DIAGNOSIS — R195 Other fecal abnormalities: Secondary | ICD-10-CM

## 2024-06-18 DIAGNOSIS — Z1211 Encounter for screening for malignant neoplasm of colon: Secondary | ICD-10-CM

## 2024-06-18 MED ORDER — NA SULFATE-K SULFATE-MG SULF 17.5-3.13-1.6 GM/177ML PO SOLN: 1.0000 | Freq: Once | ORAL | 0 refills | Status: DC

## 2024-06-18 NOTE — Telephone Encounter (Signed)
 Gastroenterology Pre-Procedure Review  Request Date: 08/28/24 Requesting Physician: Dr. Marinda  PATIENT REVIEW QUESTIONS: The patient responded to the following health history questions as indicated:    1. Are you having any GI issues? POSITIVE COLOGUARD NO GI ISSUES OR SYMPTOMS 2. Do you have a personal history of Polyps? no 3. Do you have a family history of Colon Cancer or Polyps? no 4. Diabetes Mellitus? no 5. Joint replacements in the past 12 months?no 6. Major health problems in the past 3 months?no 7. Any artificial heart valves, MVP, or defibrillator?no    MEDICATIONS & ALLERGIES:    Patient reports the following regarding taking any anticoagulation/antiplatelet therapy:   Plavix, Coumadin, Eliquis, Xarelto, Lovenox, Pradaxa, Brilinta, or Effient? no Aspirin? no  Patient confirms/reports the following medications:  Current Outpatient Medications  Medication Sig Dispense Refill   amLODipine (NORVASC) 10 MG tablet Take 10 mg by mouth daily.     clobetasol  cream (TEMOVATE ) 0.05 % Apply to itchy rash on legs 2 times daily until improved. Avoid face, groin, underarms. 60 g 1   doxazosin  (CARDURA ) 2 MG tablet TAKE 1 TABLET BY MOUTH EVERY DAY 90 tablet 1   hydrochlorothiazide (MICROZIDE) 12.5 MG capsule Take by mouth.     pravastatin (PRAVACHOL) 10 MG tablet Take by mouth.     sildenafil (VIAGRA) 50 MG tablet Take by mouth.     tacrolimus  (PROTOPIC ) 0.1 % ointment Apply topically 2 (two) times daily. 100 g 1   triamcinolone  ointment (KENALOG ) 0.1 % APPLY TO AFFECTED AREAS OF RASH AS DIRECTED. AVOID APPLYING TO FACE, GROIN, AND AXILLA. USE AS DIRECTED. RISK OF SKIN ATROPHY WITH LONG-TERM USE REVIEWED. 454 g 0   No current facility-administered medications for this visit.    Patient confirms/reports the following allergies:  Allergies  Allergen Reactions   Alfuzosin  Rash    Penile rash    No orders of the defined types were placed in this encounter.   AUTHORIZATION  INFORMATION Primary Insurance: 1D#: Group #:  Secondary Insurance: 1D#: Group #:  SCHEDULE INFORMATION: Date: 08/28/24 Time: Location: [facility]

## 2024-08-06 ENCOUNTER — Telehealth: Payer: Self-pay

## 2024-08-06 NOTE — Telephone Encounter (Signed)
 Patient called stating that he has a colonoscopy scheduled for 08/28/2024 with DR. Marinda and needs to reschedule it due to his job to 10/23/2024. I then called the endoscopy unit and left a detailed message since they are not answering. I will also call tomorrow morning to make sure that they change the date.

## 2024-08-29 ENCOUNTER — Ambulatory Visit (INDEPENDENT_AMBULATORY_CARE_PROVIDER_SITE_OTHER): Admitting: Dermatology

## 2024-08-29 ENCOUNTER — Encounter: Payer: Self-pay | Admitting: Dermatology

## 2024-08-29 DIAGNOSIS — L209 Atopic dermatitis, unspecified: Secondary | ICD-10-CM

## 2024-08-29 DIAGNOSIS — L308 Other specified dermatitis: Secondary | ICD-10-CM

## 2024-08-29 DIAGNOSIS — L300 Nummular dermatitis: Secondary | ICD-10-CM

## 2024-08-29 MED ORDER — TRIAMCINOLONE ACETONIDE 0.1 % EX OINT: TOPICAL_OINTMENT | CUTANEOUS | 1 refills | Status: AC

## 2024-08-29 NOTE — Patient Instructions (Addendum)
 Topical steroids (such as triamcinolone , fluocinolone, fluocinonide, mometasone, clobetasol , halobetasol, betamethasone, hydrocortisone) can cause thinning and lightening of the skin if they are used for too long in the same area. Your physician has selected the right strength medicine for your problem and area affected on the body. Please use your medication only as directed by your physician to prevent side effects.    Gentle Skin Care Guide  1. Bathe no more than once a day.  2. Avoid bathing in hot water  3. Use a mild soap like Dove, Vanicream, Cetaphil, CeraVe. Can use Lever 2000 or Cetaphil antibacterial soap  4. Use soap only where you need it. On most days, use it under your arms, between your legs, and on your feet. Let the water rinse other areas unless visibly dirty.  5. When you get out of the bath/shower, use a towel to gently blot your skin dry, don't rub it.  6. While your skin is still a little damp, apply a moisturizing cream such as Vanicream, CeraVe, Cetaphil, Eucerin, Sarna lotion or plain Vaseline Jelly. For hands apply Neutrogena [place] Hand Cream or Excipial Hand Cream.  7. Reapply moisturizer any time you start to itch or feel dry.  8. Sometimes using free and clear laundry detergents can be helpful. Fabric softener sheets should be avoided. Downy Free & Gentle liquid, or any liquid fabric softener that is free of dyes and perfumes, it acceptable to use  9. If your doctor has given you prescription creams you may apply moisturizers over them      Due to recent changes in healthcare laws, you may see results of your pathology and/or laboratory studies on MyChart before the doctors have had a chance to review them. We understand that in some cases there may be results that are confusing or concerning to you. Please understand that not all results are received at the same time and often the doctors may need to interpret multiple results in order to provide you with  the best plan of care or course of treatment. Therefore, we ask that you please give us  2 business days to thoroughly review all your results before contacting the office for clarification. Should we see a critical lab result, you will be contacted sooner.   If You Need Anything After Your Visit  If you have any questions or concerns for your doctor, please call our main line at [phone] and press option 4 to reach your doctor's medical assistant. If no one answers, please leave a voicemail as directed and we will return your call as soon as possible. Messages left after 4 pm will be answered the following business day.   You may also send us  a message via MyChart. We typically respond to MyChart messages within 1-2 business days.  For prescription refills, please ask your pharmacy to contact our office. Our fax number is [phone].  If you have an urgent issue when the clinic is closed that cannot wait until the next business day, you can page your doctor at the number below.    Please note that while we do our best to be available for urgent issues outside of office hours, we are not available 24/7.   If you have an urgent issue and are unable to reach us , you may choose to seek medical care at your doctor's office, retail clinic, urgent care center, or emergency room.  If you have a medical emergency, please immediately call 911 or go to the emergency department.  Pager Numbers  - Dr. Hester: [phone]  - Dr. Jackquline: [phone]  - Dr. Claudene: [phone]   - Dr. Raymund: [phone]  In the event of inclement weather, please call our main line at [phone] for an update on the status of any delays or closures.  Dermatology Medication Tips: Please keep the boxes that topical medications come in in order to help keep track of the instructions about where and how to use these. Pharmacies typically print the medication instructions only on the boxes and not directly on  the medication tubes.   If your medication is too expensive, please contact our office at [phone] option 4 or send us  a message through MyChart.   We are unable to tell what your co-pay for medications will be in advance as this is different depending on your insurance coverage. However, we may be able to find a substitute medication at lower cost or fill out paperwork to get insurance to cover a needed medication.   If a prior authorization is required to get your medication covered by your insurance company, please allow us  1-2 business days to complete this process.  Drug prices often vary depending on where the prescription is filled and some pharmacies may offer cheaper prices.  The website www.goodrx.com contains coupons for medications through different pharmacies. The prices here do not account for what the cost may be with help from insurance (it may be cheaper with your insurance), but the website can give you the price if you did not use any insurance.  - You can print the associated coupon and take it with your prescription to the pharmacy.  - You may also stop by our office during regular business hours and pick up a GoodRx coupon card.  - If you need your prescription sent electronically to a different pharmacy, notify our office through [facility] or by phone at [phone] option 4.     Si Usted Necesita Algo Despus de Su Visita  Tambin puede enviarnos un mensaje a travs de Clinical cytogeneticist. Por lo general respondemos a los mensajes de MyChart en el transcurso de 1 a 2 das hbiles.  Para renovar recetas, por favor pida a su farmacia que se ponga en contacto con nuestra oficina. Randi lakes de fax es [place] [phone].  Si tiene un asunto urgente cuando la clnica est cerrada y que no puede esperar hasta el siguiente da hbil, puede llamar/localizar a su doctor(a) al nmero que aparece a continuacin.   Por favor, tenga en cuenta que aunque hacemos todo lo  posible para estar disponibles para asuntos urgentes fuera del horario de [place], no estamos disponibles las 24 horas del da, los 7 [address]  [address] de la [place].   Si tiene un problema urgente y no puede comunicarse con nosotros, puede optar por buscar atencin mdica  en el consultorio de su doctor(a), en una clnica privada, en un centro de atencin urgente o en una sala de emergencias.  Si tiene Engineer, drilling, por favor llame inmediatamente al 911 o vaya a la sala de emergencias.  Nmeros de bper  - Dr. Hester: [phone]  - Dra. Jackquline: [phone]  - Dr. Claudene: [phone]  - Dra. Kitts: [phone]  En caso de inclemencias del [place], por favor llame a nuestra lnea principal al [phone] para una actualizacin sobre el estado de cualquier retraso o cierre.  Consejos para la medicacin en dermatologa: Por favor, guarde las cajas en las que vienen los medicamentos de uso tpico para ayudarle a seguir  las instrucciones sobre dnde y cmo usarlos. Las farmacias generalmente imprimen las instrucciones del medicamento slo en las cajas y no directamente en los tubos del [place].   Si su medicamento es muy caro, por favor, pngase en contacto con landry rieger llamando al [phone] y presione la opcin 4 o envenos un mensaje a travs de Clinical cytogeneticist.   No podemos decirle cul ser su copago por los medicamentos por adelantado ya que esto es diferente dependiendo de la cobertura de su seguro. Sin embargo, es posible que podamos encontrar un medicamento sustituto a Audiological scientist un formulario para que el seguro cubra el medicamento que se considera necesario.   Si se requiere una autorizacin previa para que su compaa de seguros malta su medicamento, por favor permtanos de 1 a 2 das hbiles para completar este proceso.  Los precios de los medicamentos varan con frecuencia dependiendo del Environmental consultant de dnde se surte la receta y alguna farmacias pueden ofrecer precios  ms baratos.  El sitio web www.goodrx.com tiene cupones para medicamentos de Health and safety inspector. Los precios aqu no tienen en cuenta lo que podra costar con la ayuda del seguro (puede ser ms barato con su seguro), pero el sitio web puede darle el precio si no utiliz Tourist information centre manager.  - Puede imprimir el cupn correspondiente y llevarlo con su receta a la farmacia.  - Tambin puede pasar por nuestra oficina durante el horario de atencin regular y Education officer, museum una tarjeta de cupones de GoodRx.  - Si necesita que su receta se enve electrnicamente a una farmacia diferente, informe a nuestra oficina a travs de MyChart de [place] o por telfono llamando al [phone] y presione la opcin 4.

## 2024-08-29 NOTE — Progress Notes (Unsigned)
   New Patient Visit   Subjective  Fred Scott is a 63 y.o. male who presents for the following: Patient reports itchy rash on his left forearm for the past 2 weeks. Patient denied all environmental, medicine, dietary changes. He states he had a burn from a dog leash in the same area about 2 weeks ago and the rash appeared a few days later. He has used neosporin, rubbing alcohol, and hydrogen peroxide on the burn.   The following portions of the chart were reviewed this encounter and updated as appropriate: medications, allergies, medical history  Review of Systems:  No other skin or systemic complaints except as noted in HPI or Assessment and Plan.  Objective  Well appearing patient in no apparent distress; mood and affect are within normal limits.  A focused examination was performed of the following areas: Left forearm   Relevant exam findings are noted in the Assessment and Plan.    Assessment & Plan   ATOPIC DERMATITIS Exam: Scaly pink papules coalescing to plaques on the left forearm  ***% BSA  Chronic and persistent condition with duration or expected duration over one year. Condition is bothersome/symptomatic for patient. Currently flared.   Atopic dermatitis (eczema) is a chronic, relapsing, pruritic condition that can significantly affect quality of life. It is often associated with allergic rhinitis and/or asthma and can require treatment with topical medications, phototherapy, or in severe cases biologic injectable medication (Dupixent; Adbry) or Oral JAK inhibitors.  Treatment Plan: - Start Triamcinolone  0.1% ointment; apply topically twice daily as needed for rash.   Topical steroids (such as triamcinolone , fluocinolone, fluocinonide, mometasone, clobetasol , halobetasol, betamethasone, hydrocortisone) can cause thinning and lightening of the skin if they are used for too long in the same area. Your physician has selected the right strength medicine for your problem  and area affected on the body. Please use your medication only as directed by your physician to prevent side effects.    Recommend gentle skin care.    OTHER ECZEMA   Related Medications tacrolimus  (PROTOPIC ) 0.1 % ointment Apply topically 2 (two) times daily. triamcinolone  ointment (KENALOG ) 0.1 % Apply to affected areas of rash as directed. Avoid applying to face, groin, and axilla. Use as directed. Risk of skin atrophy with long-term use reviewed. NUMMULAR DERMATITIS   Related Medications clobetasol  cream (TEMOVATE ) 0.05 % Apply to itchy rash on legs 2 times daily until improved. Avoid face, groin, underarms.  Return if symptoms worsen or fail to improve.  I, Emerick Ege, CMA am acting as scribe for Boneta Sharps, MD.   Documentation: I have reviewed the above documentation for accuracy and completeness, and I agree with the above.  Boneta Sharps, MD

## 2024-10-16 ENCOUNTER — Telehealth: Payer: Self-pay

## 2024-10-16 NOTE — Telephone Encounter (Signed)
 Call returned to patient to reschedule his colonoscopy from 11/26.  He said now is not a good time for him to reschedule as he is in the process of finding a new place to live.  I've informed him that I will call him back in February to reschedule.  Trish in Endo notified of cancellation.  Message routed to Leisa at our [facility] to make her aware of procedure cancellation.  Thanks, [place], CMA

## 2024-10-23 ENCOUNTER — Ambulatory Visit: Admit: 2024-10-23 | Admitting: General Surgery

## 2024-10-23 SURGERY — COLONOSCOPY
Anesthesia: General

## 2024-11-14 ENCOUNTER — Telehealth: Payer: Self-pay

## 2024-11-14 NOTE — Telephone Encounter (Signed)
 It looks like the patient cancelled his procedure again.

## 2024-11-16 ENCOUNTER — Other Ambulatory Visit: Payer: Self-pay | Admitting: General Surgery
